# Patient Record
Sex: Female | Born: 1965 | Race: White | Hispanic: No | Marital: Married | Smoking: Never smoker
Health system: Southern US, Community
[De-identification: ages and names within clinical notes are randomized; demographics above are authoritative.]

## PROBLEM LIST (undated history)

## (undated) DIAGNOSIS — F419 Anxiety disorder, unspecified: Secondary | ICD-10-CM

## (undated) DIAGNOSIS — T7840XA Allergy, unspecified, initial encounter: Secondary | ICD-10-CM

## (undated) HISTORY — DX: Anxiety disorder, unspecified: F41.9

## (undated) HISTORY — PX: TONSILLECTOMY AND ADENOIDECTOMY: SUR1326

## (undated) HISTORY — PX: OTHER SURGICAL HISTORY: SHX169

## (undated) HISTORY — DX: Allergy, unspecified, initial encounter: T78.40XA

---

## 1998-04-09 ENCOUNTER — Other Ambulatory Visit: Admission: RE | Admit: 1998-04-09 | Discharge: 1998-04-09 | Payer: Self-pay | Admitting: Obstetrics and Gynecology

## 1999-03-25 ENCOUNTER — Encounter: Admission: RE | Admit: 1999-03-25 | Discharge: 1999-03-25 | Payer: Self-pay | Admitting: Obstetrics and Gynecology

## 1999-03-25 ENCOUNTER — Encounter: Payer: Self-pay | Admitting: Obstetrics and Gynecology

## 1999-05-27 ENCOUNTER — Other Ambulatory Visit: Admission: RE | Admit: 1999-05-27 | Discharge: 1999-05-27 | Payer: Self-pay | Admitting: Obstetrics and Gynecology

## 2000-05-23 ENCOUNTER — Other Ambulatory Visit: Admission: RE | Admit: 2000-05-23 | Discharge: 2000-05-23 | Payer: Self-pay | Admitting: Obstetrics and Gynecology

## 2000-07-25 ENCOUNTER — Other Ambulatory Visit: Admission: RE | Admit: 2000-07-25 | Discharge: 2000-07-25 | Payer: Self-pay | Admitting: Obstetrics and Gynecology

## 2000-10-17 ENCOUNTER — Ambulatory Visit: Admission: RE | Admit: 2000-10-17 | Discharge: 2000-10-17 | Payer: Self-pay | Admitting: *Deleted

## 2001-01-28 ENCOUNTER — Inpatient Hospital Stay (HOSPITAL_COMMUNITY): Admission: AD | Admit: 2001-01-28 | Discharge: 2001-01-31 | Payer: Self-pay | Admitting: Obstetrics and Gynecology

## 2001-02-01 ENCOUNTER — Encounter: Admission: RE | Admit: 2001-02-01 | Discharge: 2001-03-03 | Payer: Self-pay | Admitting: *Deleted

## 2001-03-07 ENCOUNTER — Other Ambulatory Visit: Admission: RE | Admit: 2001-03-07 | Discharge: 2001-03-07 | Payer: Self-pay | Admitting: *Deleted

## 2001-04-03 ENCOUNTER — Encounter: Admission: RE | Admit: 2001-04-03 | Discharge: 2001-05-03 | Payer: Self-pay | Admitting: *Deleted

## 2001-05-04 ENCOUNTER — Encounter: Admission: RE | Admit: 2001-05-04 | Discharge: 2001-06-03 | Payer: Self-pay | Admitting: *Deleted

## 2002-03-12 ENCOUNTER — Other Ambulatory Visit: Admission: RE | Admit: 2002-03-12 | Discharge: 2002-03-12 | Payer: Self-pay | Admitting: *Deleted

## 2004-03-27 HISTORY — PX: OTHER SURGICAL HISTORY: SHX169

## 2004-06-03 ENCOUNTER — Encounter: Admission: RE | Admit: 2004-06-03 | Discharge: 2004-06-03 | Payer: Self-pay | Admitting: *Deleted

## 2004-11-24 ENCOUNTER — Ambulatory Visit (HOSPITAL_COMMUNITY): Admission: RE | Admit: 2004-11-24 | Discharge: 2004-11-24 | Payer: Self-pay | Admitting: Urology

## 2004-11-24 ENCOUNTER — Ambulatory Visit (HOSPITAL_BASED_OUTPATIENT_CLINIC_OR_DEPARTMENT_OTHER): Admission: RE | Admit: 2004-11-24 | Discharge: 2004-11-24 | Payer: Self-pay | Admitting: Urology

## 2005-06-16 ENCOUNTER — Encounter: Admission: RE | Admit: 2005-06-16 | Discharge: 2005-06-16 | Payer: Self-pay | Admitting: *Deleted

## 2006-07-20 ENCOUNTER — Encounter: Admission: RE | Admit: 2006-07-20 | Discharge: 2006-07-20 | Payer: Self-pay | Admitting: *Deleted

## 2006-08-01 ENCOUNTER — Encounter: Admission: RE | Admit: 2006-08-01 | Discharge: 2006-08-01 | Payer: Self-pay | Admitting: *Deleted

## 2007-02-06 ENCOUNTER — Encounter: Admission: RE | Admit: 2007-02-06 | Discharge: 2007-02-06 | Payer: Self-pay | Admitting: *Deleted

## 2007-09-04 ENCOUNTER — Encounter: Admission: RE | Admit: 2007-09-04 | Discharge: 2007-09-04 | Payer: Self-pay | Admitting: *Deleted

## 2008-02-26 ENCOUNTER — Encounter: Admission: RE | Admit: 2008-02-26 | Discharge: 2008-02-26 | Payer: Self-pay | Admitting: Obstetrics

## 2008-06-12 ENCOUNTER — Encounter: Admission: RE | Admit: 2008-06-12 | Discharge: 2008-06-12 | Payer: Self-pay | Admitting: Obstetrics

## 2008-08-13 ENCOUNTER — Emergency Department (HOSPITAL_COMMUNITY): Admission: EM | Admit: 2008-08-13 | Discharge: 2008-08-13 | Payer: Self-pay | Admitting: Emergency Medicine

## 2008-09-18 ENCOUNTER — Encounter: Admission: RE | Admit: 2008-09-18 | Discharge: 2008-09-18 | Payer: Self-pay | Admitting: Obstetrics

## 2009-09-22 ENCOUNTER — Encounter: Admission: RE | Admit: 2009-09-22 | Discharge: 2009-09-22 | Payer: Self-pay | Admitting: Obstetrics

## 2010-08-12 NOTE — Op Note (Signed)
NAME:  Theresa Mccall, Theresa Mccall             ACCOUNT NO.:  [id number]   MEDICAL RECORD NO.:  [id number]          PATIENT TYPE:  AMB   LOCATION:  [facility]                         FACILITY:  [facility]   PHYSICIAN:  Sigmund I. Patsi Sears, M.D.DATE OF BIRTH:  February 03, 1966   DATE OF PROCEDURE:  11/24/2004  DATE OF DISCHARGE:                                 OPERATIVE REPORT   PREOPERATIVE DIAGNOSIS:  Stress urinary incontinence.   POSTOPERATIVE DIAGNOSIS:  Stress urinary incontinence.   OPERATIONS:  Child psychotherapist.   SURGEON:  Sigmund I. Patsi Sears, M.D.   ASSISTANT:  Vedia Coffer, N.P.-C.   PREPARATION:  After appropriate preanesthesia, the patient was brought to  the operating room, placed on the operating table in dorsal supine position  where general LMA anesthesia was introduced. She was then replaced in the  dorsal lithotomy position where the pubis was prepped with Betadine solution  and draped in the usual fashion..   DESCRIPTION OF PROCEDURE:  Cystourethroscopy was accomplished, and clear  efflux was seen from both orifices. Following this, the bladder was drained  of fluid with a Foley catheter. The 10 mL Marcaine 0.25 plain was injected  into the pubovaginal cervical arch tissue. A B&O suppository was given.   A 2 cm incision was made over the middle of the urethra, subcutaneous tissue  dissected with sharp and blunt dissection. Marking was accomplished 5 cm  lateral to the clitoris on both sides with a marker. Stab wounds were then  made bilaterally. Using the [facility], the Obtryx  pubovaginal sling was placed in retrograde fashion. Cystoscopy revealed no  evidence of any sling material in the bladder, and again, the trigone  appeared normal. The ureteral orifices were also normal.   Tensioning was accomplished with a large right-angle clamp behind the sling  material, and the wings of the sling were then cut in the subcutaneous  tissue in the  paralabial incisions.   These incisions were then closed with Dermabond. The wound was closed with  running 2-0 Vicryl suture. The patient was given IV Toradol, awakened and  taken to the recovery room in good condition.      Sigmund I. Patsi Sears, M.D.  Electronically Signed     SIT/MEDQ  D:  11/24/2004  T:  11/24/2004  Job:  [id number]

## 2010-08-12 NOTE — Op Note (Signed)
[facility]  Patient:    Theresa Mccall, Theresa Mccall Visit Number: [id number] MRN: [id number]          Service Type: OBS Location: [id number] Attending Physician:  Ermalene Searing Dictated by:   Marina Gravel, M.D. Proc. Date: 01/29/01 Admit Date:  01/28/2001                             Operative Report  PREOPERATIVE DIAGNOSIS:       Maternal exhaustion.  POSTOPERATIVE DIAGNOSIS:      Maternal exhaustion.  PROCEDURE:                    Vacuum-assisted vaginal delivery.  SURGEON:                      Marina Gravel, M.D.  ANESTHESIA:                   Epidural.  INDICATIONS:                  Maternal exhaustion, pushing for over two hours with the inability to push further.  The patient had good descent to complete-complete and +3, ROT position after spontaneous expulsive efforts. However, at this point, the patient was exhausted and requested assistance.  The operative risks were discussed including maternal trauma and fetal injury such as cephalohematoma or bruising of the scalp.  DESCRIPTION OF PROCEDURE:     After adequate anesthesia was obtained with her epidural, the patient was prepped and draped in the standard fashion.  The fetal vertex was noted to be at +3 station with visible fetal scalp between the labia without separation of the labia.  The presentation was again noted to be ROT.  The mushroom cup vacuum was placed on the fetal vertex just anterior to the posterior fontanelle in the midline.  One gentle traction and a second-degree episiotomy were required for delivery.  The remainder of the infant was delivered without difficulty.  The cord was clamped and cut.  A vigorous female infant was noted.  Apgars were 9 and 9. The infant was suctioned with the bulb on the perineum prior to delivery.  The placenta was expelled spontaneously.  The episiotomy was closed in layers with 2-0 Vicryl on the partial third-degree extension and 2-0 and 3-0  chromic on the vagina and perineum.  The patient tolerated the procedure well.  There were no complications. Mother and baby were doing well in the delivery room after the procedure. Dictated by:   Marina Gravel, M.D. Attending Physician:  Marina Gravel B DD:  01/29/01 TD:  01/30/01 Job: [id number]

## 2010-08-31 ENCOUNTER — Other Ambulatory Visit: Payer: Self-pay | Admitting: Obstetrics

## 2010-08-31 DIAGNOSIS — Z1231 Encounter for screening mammogram for malignant neoplasm of breast: Secondary | ICD-10-CM

## 2010-10-05 ENCOUNTER — Ambulatory Visit
Admission: RE | Admit: 2010-10-05 | Discharge: 2010-10-05 | Disposition: A | Discharge status: Home / Self Care | Payer: BC Managed Care – PPO | Source: Ambulatory Visit | Attending: Obstetrics | Admitting: Obstetrics

## 2010-10-05 DIAGNOSIS — Z1231 Encounter for screening mammogram for malignant neoplasm of breast: Secondary | ICD-10-CM

## 2011-06-26 HISTORY — PX: OTHER SURGICAL HISTORY: SHX169

## 2011-10-03 ENCOUNTER — Other Ambulatory Visit: Payer: Self-pay | Admitting: Obstetrics

## 2011-10-03 DIAGNOSIS — Z1231 Encounter for screening mammogram for malignant neoplasm of breast: Secondary | ICD-10-CM

## 2011-10-20 ENCOUNTER — Ambulatory Visit
Admission: RE | Admit: 2011-10-20 | Discharge: 2011-10-20 | Disposition: A | Discharge status: Home / Self Care | Payer: BC Managed Care – PPO | Source: Ambulatory Visit | Attending: Obstetrics | Admitting: Obstetrics

## 2011-10-20 DIAGNOSIS — Z1231 Encounter for screening mammogram for malignant neoplasm of breast: Secondary | ICD-10-CM

## 2012-10-22 ENCOUNTER — Other Ambulatory Visit: Payer: Self-pay

## 2012-10-22 DIAGNOSIS — Z1231 Encounter for screening mammogram for malignant neoplasm of breast: Secondary | ICD-10-CM

## 2012-11-08 ENCOUNTER — Ambulatory Visit: Payer: BC Managed Care – PPO

## 2012-11-15 ENCOUNTER — Ambulatory Visit
Admission: RE | Admit: 2012-11-15 | Discharge: 2012-11-15 | Disposition: A | Discharge status: Home / Self Care | Payer: BC Managed Care – PPO | Source: Ambulatory Visit

## 2012-11-15 DIAGNOSIS — Z1231 Encounter for screening mammogram for malignant neoplasm of breast: Secondary | ICD-10-CM

## 2013-03-27 HISTORY — PX: COLONOSCOPY: SHX174

## 2013-04-22 ENCOUNTER — Encounter: Payer: Self-pay | Admitting: Neurology

## 2013-04-24 ENCOUNTER — Ambulatory Visit (INDEPENDENT_AMBULATORY_CARE_PROVIDER_SITE_OTHER): Payer: BC Managed Care – PPO | Admitting: Neurology

## 2013-04-24 ENCOUNTER — Encounter (INDEPENDENT_AMBULATORY_CARE_PROVIDER_SITE_OTHER): Payer: Self-pay

## 2013-04-24 ENCOUNTER — Encounter: Payer: Self-pay | Admitting: Neurology

## 2013-04-24 VITALS — BP 134/84 | HR 67 | Ht 61.5 in | Wt 145.0 lb

## 2013-04-24 DIAGNOSIS — R55 Syncope and collapse: Secondary | ICD-10-CM | POA: Insufficient documentation

## 2013-04-24 NOTE — Progress Notes (Signed)
[facility]    Provider:  Dr Janann Colonel Referring Provider: Ala Dach., MD Primary Care Physician:  Ala Dach., MD  CC:  Presyncopal episodes  HPI:  Theresa Mccall is a 48 y.o. female here as a referral from Dr. Pamala Hurry for presyncopal episodes  On December 28 patient had acute onset of episodes where she started feeling unsteady, very to the right, this happened twice. She sat down and then noted she had double vision. The symptoms did not improve with sitting down or lying down. Her husband notes that she appeared drunk while walking. Patient describes feeling mildly off balance otherwise unremarkable. She is able to communicate fully with her family during these events. No automatisms, no extremity twitching. Prior to the episode started she had gone for brisk walk but otherwise had an unremarkable morning. Of note she did not eat breakfast which she normally does do. She was given some August juice, felt nauseous while taking it, lay down around 30 minutes later woke up and felt back to her normal self. No recent fevers emesis, no change of medication. No palpitations or diaphoresis during the event. No focal weakness or sensory changes.  That week had not slept well, was in a different time zone. Had flown to [place] on December 26. No prior events like this, no syncopal episodes.   Takes Xanax very infrequently, had not taken one around that time or the prior night.    Review of Systems: Out of a complete 14 system review, the patient complains of only the following symptoms, and all other reviewed systems are negative. No positive review of systems at this time  History   Social History  . Marital Status: Married    Spouse Name: Theresa Mccall    Number of Children: 1  . Years of Education: Doctorate   Occupational History  . Not on file.   Social History Main Topics  . Smoking status: Never Smoker   . Smokeless tobacco: Not on file  . Alcohol Use:  0.0 oz/week     Comment: 2-4 glasses q week  . Drug Use: No  . Sexual Activity: Not on file   Other Topics Concern  . Not on file   Social History Narrative   Patient is married Theresa Mccall), has 1 child   Patient is right handed   Education level is Doctorate   Caffeine consumption is 1 cup daily    Family History  Problem Relation Age of Onset  . Stroke Father   . Heart attack Father   . Cancer - Other Father     Bladder Cancer  . Heart disease Father   . Heart disease Maternal Grandfather   . Hypertension Father     Chronic  . Hypertension Maternal Grandfather     Chronic  . Cancer - Other Paternal Grandfather     Esophageal  . Cancer - Other Maternal Aunt     Breast  . COPD Father     No past medical history on file.  Past Surgical History  Procedure Laterality Date  . Tvto  2006    DR Gaynelle Arabian  . Nasal septal & sinus surgery    . Tonsillectomy and adenoidectomy    . Lasix  06/2011    Current Outpatient Prescriptions  Medication Sig Dispense Refill  . ALPRAZolam (XANAX) 1 MG tablet Take 1 mg by mouth every 4 (four) hours as needed for anxiety.      Marland Kitchen aspirin EC 81 MG tablet Take  81 mg by mouth daily.      Marland Kitchen azelastine (ASTELIN) 137 MCG/SPRAY nasal spray Place into both nostrils 2 (two) times daily. Use in each nostril as directed      . fexofenadine (ALLEGRA) 180 MG tablet Take 180 mg by mouth daily.      . fluticasone (FLONASE) 50 MCG/ACT nasal spray Place into both nostrils daily.      Marland Kitchen levocetirizine (XYZAL) 5 MG tablet       . LO LOESTRIN FE 1 MG-10 MCG / 10 MCG tablet       . Multiple Vitamin (MULTIVITAMIN) tablet Take 1 tablet by mouth daily.      . norethindrone-ethinyl estradiol (JUNEL FE,GILDESS FE,LOESTRIN FE) 1-20 MG-MCG tablet Take 1 tablet by mouth daily.      . Omeprazole (PRILOSEC PO) Take by mouth.      . zolpidem (AMBIEN) 10 MG tablet Take 10 mg by mouth at bedtime as needed for sleep.       No current facility-administered medications for  this visit.    Allergies as of 04/24/2013 - Review Complete 04/24/2013  Allergen Reaction Noted  . Ceftin [cefuroxime axetil]  04/22/2013  . Amoxicillin Rash 04/22/2013  . Demerol [meperidine] Rash 04/22/2013    Vitals: BP 128/78  Pulse 70  Ht 5' 1.5" (1.562 m)  Wt 145 lb (65.772 kg)  BMI 26.96 kg/m2 Last Weight:  Wt Readings from Last 1 Encounters:  04/24/13 145 lb (65.772 kg)   Last Height:   Ht Readings from Last 1 Encounters:  04/24/13 5' 1.5" (1.562 m)     Physical exam: Exam: Gen: NAD, conversant Eyes: anicteric sclerae, moist conjunctivae HENT: Atraumatic, oropharynx clear Neck: Trachea midline; supple,  Lungs: CTA, no wheezing, rales, rhonic                          CV: RRR, no MRG Abdomen: Soft, non-tender;  Extremities: No peripheral edema  Skin: Normal temperature, no rash,  Psych: Appropriate affect, pleasant  Neuro: MS: AA&Ox3, appropriately interactive, normal affect   Speech: fluent w/o paraphasic error  Memory: good recent and remote recall  CN: PERRL, EOMI no nystagmus, no ptosis, sensation intact to LT V1-V3 bilat, face symmetric, no weakness, hearing grossly intact, palate elevates symmetrically, shoulder shrug 5/5 bilat,  tongue protrudes midline, no fasiculations noted.  Motor: normal bulk and tone Strength: 5/5  In all extremities  Coord: rapid alternating and point-to-point (FNF, HTS) movements intact.  Reflexes: symmetrical, bilat downgoing toes  Sens: LT intact in all extremities  Gait: posture, stance, stride and arm-swing normal. Tandem gait intact. Able to walk on heels and toes. Romberg absent.   Assessment:  After physical and neurologic examination, review of laboratory studies, imaging, neurophysiology testing and pre-existing records, assessment will be reviewed on the problem list.  Plan:  Treatment plan and additional workup will be reviewed under Problem List.  1)Pre-syncope  [name] is a pleasant 48 woman  presenting for initial evaluation of single episode of gait instability, and double vision. This happened refill lasted less than an hour, patient had not eaten any breakfast that morning. Symptoms resolved after taking some orange juice and briefly resting. Physical exam is overall unremarkable. Suspect symptoms are likely related to hypoglycemia. Based on single event, normal exam and no further events would not pursue further diagnostic workup at this time. If any similar events would consider brain MRI, carotid ultrasound and/or EEG. Follow up as needed.  Jim Like, DO  [facility] [address] [place], [place] [address]  Phone [phone] Fax [phone]

## 2013-10-31 ENCOUNTER — Other Ambulatory Visit: Payer: Self-pay

## 2013-10-31 DIAGNOSIS — Z1231 Encounter for screening mammogram for malignant neoplasm of breast: Secondary | ICD-10-CM

## 2013-11-27 ENCOUNTER — Ambulatory Visit
Admission: RE | Admit: 2013-11-27 | Discharge: 2013-11-27 | Disposition: A | Discharge status: Home / Self Care | Payer: BC Managed Care – PPO | Source: Ambulatory Visit

## 2013-11-27 DIAGNOSIS — Z1231 Encounter for screening mammogram for malignant neoplasm of breast: Secondary | ICD-10-CM

## 2014-01-09 ENCOUNTER — Other Ambulatory Visit: Payer: Self-pay

## 2014-10-27 ENCOUNTER — Other Ambulatory Visit: Payer: Self-pay

## 2014-10-27 DIAGNOSIS — Z1231 Encounter for screening mammogram for malignant neoplasm of breast: Secondary | ICD-10-CM

## 2014-12-10 ENCOUNTER — Ambulatory Visit
Admission: RE | Admit: 2014-12-10 | Discharge: 2014-12-10 | Disposition: A | Discharge status: Home / Self Care | Payer: BLUE CROSS/BLUE SHIELD | Source: Ambulatory Visit

## 2014-12-10 DIAGNOSIS — Z1231 Encounter for screening mammogram for malignant neoplasm of breast: Secondary | ICD-10-CM

## 2015-01-15 ENCOUNTER — Ambulatory Visit
Admission: RE | Admit: 2015-01-15 | Discharge: 2015-01-15 | Disposition: A | Discharge status: Home / Self Care | Payer: BLUE CROSS/BLUE SHIELD | Source: Ambulatory Visit | Attending: Allergy and Immunology | Admitting: Allergy and Immunology

## 2015-01-15 ENCOUNTER — Other Ambulatory Visit: Payer: Self-pay | Admitting: Allergy and Immunology

## 2015-01-15 DIAGNOSIS — R05 Cough: Secondary | ICD-10-CM

## 2015-01-15 DIAGNOSIS — R059 Cough, unspecified: Secondary | ICD-10-CM

## 2015-07-02 DIAGNOSIS — F4322 Adjustment disorder with anxiety: Secondary | ICD-10-CM | POA: Diagnosis not present

## 2015-07-02 DIAGNOSIS — J3089 Other allergic rhinitis: Secondary | ICD-10-CM | POA: Diagnosis not present

## 2015-07-02 DIAGNOSIS — J3081 Allergic rhinitis due to animal (cat) (dog) hair and dander: Secondary | ICD-10-CM | POA: Diagnosis not present

## 2015-07-22 DIAGNOSIS — F4322 Adjustment disorder with anxiety: Secondary | ICD-10-CM | POA: Diagnosis not present

## 2015-08-12 DIAGNOSIS — F4322 Adjustment disorder with anxiety: Secondary | ICD-10-CM | POA: Diagnosis not present

## 2015-09-10 DIAGNOSIS — F4322 Adjustment disorder with anxiety: Secondary | ICD-10-CM | POA: Diagnosis not present

## 2015-10-08 DIAGNOSIS — F4322 Adjustment disorder with anxiety: Secondary | ICD-10-CM | POA: Diagnosis not present

## 2015-10-28 DIAGNOSIS — F4322 Adjustment disorder with anxiety: Secondary | ICD-10-CM | POA: Diagnosis not present

## 2015-11-25 DIAGNOSIS — F4322 Adjustment disorder with anxiety: Secondary | ICD-10-CM | POA: Diagnosis not present

## 2015-12-21 ENCOUNTER — Other Ambulatory Visit: Payer: Self-pay | Admitting: Obstetrics

## 2015-12-21 DIAGNOSIS — Z1231 Encounter for screening mammogram for malignant neoplasm of breast: Secondary | ICD-10-CM

## 2016-01-06 DIAGNOSIS — F4322 Adjustment disorder with anxiety: Secondary | ICD-10-CM | POA: Diagnosis not present

## 2016-01-07 DIAGNOSIS — N390 Urinary tract infection, site not specified: Secondary | ICD-10-CM | POA: Diagnosis not present

## 2016-01-07 DIAGNOSIS — Z01419 Encounter for gynecological examination (general) (routine) without abnormal findings: Secondary | ICD-10-CM | POA: Diagnosis not present

## 2016-01-07 DIAGNOSIS — Z1159 Encounter for screening for other viral diseases: Secondary | ICD-10-CM | POA: Diagnosis not present

## 2016-01-07 DIAGNOSIS — Z6827 Body mass index (BMI) 27.0-27.9, adult: Secondary | ICD-10-CM | POA: Diagnosis not present

## 2016-01-07 DIAGNOSIS — Z114 Encounter for screening for human immunodeficiency virus [HIV]: Secondary | ICD-10-CM | POA: Diagnosis not present

## 2016-01-07 DIAGNOSIS — Z113 Encounter for screening for infections with a predominantly sexual mode of transmission: Secondary | ICD-10-CM | POA: Diagnosis not present

## 2016-01-13 DIAGNOSIS — J301 Allergic rhinitis due to pollen: Secondary | ICD-10-CM | POA: Diagnosis not present

## 2016-01-13 DIAGNOSIS — J3089 Other allergic rhinitis: Secondary | ICD-10-CM | POA: Diagnosis not present

## 2016-01-13 DIAGNOSIS — R05 Cough: Secondary | ICD-10-CM | POA: Diagnosis not present

## 2016-01-13 DIAGNOSIS — J3081 Allergic rhinitis due to animal (cat) (dog) hair and dander: Secondary | ICD-10-CM | POA: Diagnosis not present

## 2016-01-21 ENCOUNTER — Ambulatory Visit
Admission: RE | Admit: 2016-01-21 | Discharge: 2016-01-21 | Disposition: A | Discharge status: Home / Self Care | Payer: BLUE CROSS/BLUE SHIELD | Source: Ambulatory Visit | Attending: Obstetrics | Admitting: Obstetrics

## 2016-01-21 DIAGNOSIS — Z1231 Encounter for screening mammogram for malignant neoplasm of breast: Secondary | ICD-10-CM

## 2016-02-11 DIAGNOSIS — F4322 Adjustment disorder with anxiety: Secondary | ICD-10-CM | POA: Diagnosis not present

## 2016-02-24 DIAGNOSIS — L821 Other seborrheic keratosis: Secondary | ICD-10-CM | POA: Diagnosis not present

## 2016-02-24 DIAGNOSIS — L820 Inflamed seborrheic keratosis: Secondary | ICD-10-CM | POA: Diagnosis not present

## 2016-02-24 DIAGNOSIS — D1800 Hemangioma unspecified site: Secondary | ICD-10-CM | POA: Diagnosis not present

## 2016-02-24 DIAGNOSIS — D225 Melanocytic nevi of trunk: Secondary | ICD-10-CM | POA: Diagnosis not present

## 2016-02-24 DIAGNOSIS — L814 Other melanin hyperpigmentation: Secondary | ICD-10-CM | POA: Diagnosis not present

## 2016-03-17 DIAGNOSIS — F4322 Adjustment disorder with anxiety: Secondary | ICD-10-CM | POA: Diagnosis not present

## 2016-03-23 DIAGNOSIS — J3081 Allergic rhinitis due to animal (cat) (dog) hair and dander: Secondary | ICD-10-CM | POA: Diagnosis not present

## 2016-03-23 DIAGNOSIS — J3089 Other allergic rhinitis: Secondary | ICD-10-CM | POA: Diagnosis not present

## 2016-04-14 DIAGNOSIS — F4322 Adjustment disorder with anxiety: Secondary | ICD-10-CM | POA: Diagnosis not present

## 2016-05-11 DIAGNOSIS — F4322 Adjustment disorder with anxiety: Secondary | ICD-10-CM | POA: Diagnosis not present

## 2016-06-09 DIAGNOSIS — F4322 Adjustment disorder with anxiety: Secondary | ICD-10-CM | POA: Diagnosis not present

## 2016-07-07 DIAGNOSIS — F4322 Adjustment disorder with anxiety: Secondary | ICD-10-CM | POA: Diagnosis not present

## 2016-07-11 ENCOUNTER — Emergency Department (HOSPITAL_COMMUNITY)
Admission: EM | Admit: 2016-07-11 | Discharge: 2016-07-11 | Disposition: A | Discharge status: Home / Self Care | Payer: BLUE CROSS/BLUE SHIELD | Attending: Emergency Medicine | Admitting: Emergency Medicine

## 2016-07-11 ENCOUNTER — Encounter (HOSPITAL_COMMUNITY): Payer: Self-pay | Admitting: Emergency Medicine

## 2016-07-11 DIAGNOSIS — Z79899 Other long term (current) drug therapy: Secondary | ICD-10-CM | POA: Diagnosis not present

## 2016-07-11 DIAGNOSIS — Y999 Unspecified external cause status: Secondary | ICD-10-CM | POA: Diagnosis not present

## 2016-07-11 DIAGNOSIS — R42 Dizziness and giddiness: Secondary | ICD-10-CM | POA: Insufficient documentation

## 2016-07-11 DIAGNOSIS — S0990XA Unspecified injury of head, initial encounter: Secondary | ICD-10-CM

## 2016-07-11 DIAGNOSIS — Y9342 Activity, yoga: Secondary | ICD-10-CM | POA: Insufficient documentation

## 2016-07-11 DIAGNOSIS — W228XXA Striking against or struck by other objects, initial encounter: Secondary | ICD-10-CM | POA: Diagnosis not present

## 2016-07-11 DIAGNOSIS — S060X0A Concussion without loss of consciousness, initial encounter: Secondary | ICD-10-CM | POA: Insufficient documentation

## 2016-07-11 DIAGNOSIS — S098XXA Other specified injuries of head, initial encounter: Secondary | ICD-10-CM | POA: Diagnosis not present

## 2016-07-11 DIAGNOSIS — Y9289 Other specified places as the place of occurrence of the external cause: Secondary | ICD-10-CM | POA: Insufficient documentation

## 2016-07-11 DIAGNOSIS — Z7982 Long term (current) use of aspirin: Secondary | ICD-10-CM | POA: Diagnosis not present

## 2016-07-11 MED ORDER — MECLIZINE HCL 12.5 MG PO TABS: 12.5000 mg | ORAL_TABLET | Freq: Three times a day (TID) | ORAL | 0 refills | Status: AC | PRN

## 2016-07-11 NOTE — ED Provider Notes (Signed)
Emergency Department Provider Note  By signing my name below, I, Collene Leyden, attest that this documentation has been prepared under the direction and in the presence of Margette Fast, MD. Electronically Signed: Collene Leyden, Scribe. 07/11/16. 2:00 PM.  I have reviewed the triage vital signs and the nursing notes.   HISTORY  Chief Complaint Head Injury   HPI  Theresa Mccall is a 51 y.o. female with no pertinent medical history, who presents to the Emergency Department complaining of a sudden-onset, constant posterior headache s/p head injury that happened 3 days ago. Patient states she used a yoga ball to "prop herself up" while reaching for papers off of a cabinet, when she fell and hit her head on the floor 4 days ago. Patient denies any loss of consciousness. Patient reports associated right shoulder pain, cervical neck pain, bilateral wrist pain, right elbow pain, and vertigo. No modifying factors indicated. Patient denies any ear pain, ear bleeding, dental injury, nausea, vomiting, or any symptoms with head rotation.   Patient is also complaining of occasional unsteady gait. Patient reports attempting to walk straight, when she begins to drift to the left. Patient reports being seen for this problem in the past. Patient was seen by a neurologist three years ago, in which her symptoms were noted to be related to hypoglycemia. Imaging was noted to be negative during this visit. Patient denies any other symptoms. No active gait instability since the fall.   History reviewed. No pertinent past medical history.  Patient Active Problem List   Diagnosis Date Noted  . Pre-syncope 04/24/2013    Past Surgical History:  Procedure Laterality Date  . Lasix  06/2011  . nasal septal & sinus surgery    . TONSILLECTOMY AND ADENOIDECTOMY    . TVTO  2006   DR Gaynelle Arabian    Current Outpatient Rx  . Order #: [id number] Class: Historical Med  . Order #: [id number] Class: Historical Med  .  Order #: [id number] Class: Historical Med  . Order #: [id number] Class: Historical Med  . Order #: [id number] Class: Historical Med  . Order #: [id number] Class: Historical Med  . Order #: [id number] Class: Historical Med  . Order #: [id number] Class: Print  . Order #: [id number] Class: Historical Med  . Order #: [id number] Class: Historical Med  . Order #: [id number] Class: Historical Med  . Order #: [id number] Class: Historical Med    Allergies Ceftin [cefuroxime axetil]; Amoxicillin; and Demerol [meperidine]  Family History  Problem Relation Age of Onset  . Stroke Father   . Heart attack Father   . Cancer - Other Father     Bladder Cancer  . Heart disease Father   . Heart disease Maternal Grandfather   . Hypertension Father     Chronic  . Hypertension Maternal Grandfather     Chronic  . Cancer - Other Paternal Grandfather     Esophageal  . Cancer - Other Maternal Aunt     Breast  . COPD Father     Social History Social History  Substance Use Topics  . Smoking status: Never Smoker  . Smokeless tobacco: Not on file  . Alcohol use 0.0 oz/week     Comment: 2-4 glasses q week    Review of Systems Constitutional: No fever/chills Eyes: No visual changes. ENT: No sore throat. Cardiovascular: Denies chest pain. Respiratory: Denies shortness of breath. Gastrointestinal: No abdominal pain.  No nausea, no vomiting.  No diarrhea.  No constipation. Genitourinary: Negative for dysuria. Musculoskeletal: Negative for back pain. Positive for bilateral pain.  Positive for right shoulder pain. Positive for neck pain.  Skin: Negative for rash. Neurological: Positive for headaches. No focal weakness or numbness. Positive for vertigo.   10-point ROS otherwise negative.  ____________________________________________   PHYSICAL EXAM:  VITAL SIGNS: ED Triage Vitals  Enc Vitals Group     BP 07/11/16 1323 (!) 187/94     Pulse Rate 07/11/16 1323 72     Resp 07/11/16 1323 18     Temp 07/11/16 1323 97.5 F  (36.4 C)     Temp Source 07/11/16 1323 Oral     SpO2 07/11/16 1323 99 %     Pain Score 07/11/16 1327 6   Constitutional: Alert and oriented. Well appearing and in no acute distress. Eyes/Ears: Conjunctivae are normal. EOMI. PERRL. Negative Dix-Hallpike. Normal TMs bilaterally. Normal external ear canals.  Head: Atraumatic. Nose: No congestion/rhinnorhea. Mouth/Throat: Mucous membranes are moist.  Oropharynx non-erythematous. Neck: No stridor.  Cardiovascular: Normal rate, regular rhythm. Good peripheral circulation. Grossly normal heart sounds.   Respiratory: Normal respiratory effort.  No retractions. Lungs CTAB. Gastrointestinal: Soft and nontender. No distention.  Musculoskeletal: No lower extremity tenderness nor edema. No gross deformities of extremities. Neurologic:  Normal speech and language. No gross focal neurologic deficits are appreciated. Normal CN exam 2-12. Normal gait. Normal finger-to-nose testing. No pronator drift.  Skin:  Skin is warm, dry and intact. No rash noted. Psychiatric: Mood and affect are normal. Speech and behavior are normal. ____________________________________________  RADIOLOGY  None ____________________________________________   PROCEDURES  Procedure(s) performed:   Procedures  None ____________________________________________   INITIAL IMPRESSION / ASSESSMENT AND PLAN / ED COURSE  Pertinent labs & imaging results that were available during my care of the patient were reviewed by me and considered in my medical decision making (see chart for details).  Patient presents to the ED 3 days after falling backwards from a yoga ball (2-3 feet off ground). No LOC. Patient has had positional vertigo at times since the fall along with HA. No evidence of head trauma on exam. Completely normal neurological exam and HEENT exam. Discussed the pros and cons of head imaging at this time and opted for imaging deferral given normal exam and time since  injury. Clinically suspect post-concussion syndrome. No clinical signs or symptoms to suggest acute intracranial hemorrhage, skull fracture, or TBI. Provided concussion provider f/u and meclizine for symptomatic vertigo. Vertigo is intermittent and positional. Unable to provoke symptoms during exam. No vertigo currently.   At this time, I do not feel there is any life-threatening condition present. I have reviewed and discussed all results (EKG, imaging, lab, urine as appropriate), exam findings with patient. I have reviewed nursing notes and appropriate previous records.  I feel the patient is safe to be discharged home without further emergent workup. Discussed usual and customary return precautions. Patient and family (if present) verbalize understanding and are comfortable with this plan.  Patient will follow-up with their primary care provider. If they do not have a primary care provider, information for follow-up has been provided to them. All questions have been answered.  ____________________________________________  FINAL CLINICAL IMPRESSION(S) / ED DIAGNOSES  Final diagnoses:  Injury of head, initial encounter  Concussion without loss of consciousness, initial encounter  Vertigo     MEDICATIONS GIVEN DURING THIS VISIT:  None  NEW OUTPATIENT MEDICATIONS STARTED DURING THIS VISIT:  Discharge Medication List as of 07/11/2016  2:10 PM    START taking these medications   Details  meclizine (ANTIVERT) 12.5 MG tablet  Take 1 tablet (12.5 mg total) by mouth 3 (three) times daily as needed for dizziness., Starting Tue 07/11/2016, Print        I personally performed the services described in this documentation, which was scribed in my presence. The recorded information has been reviewed and is accurate.    Nanda Quinton, MD Emergency Medicine    Margette Fast, MD 07/14/16 [phone]

## 2016-07-11 NOTE — ED Notes (Signed)
Pt states understanding of instructions. Home stable with steady gait.

## 2016-07-11 NOTE — Discharge Instructions (Signed)

## 2016-07-11 NOTE — ED Triage Notes (Signed)
Pt sts fell of yoga ball on Saturday and hit head on floor as well as wrists; pt unsure about LOC; pt sts dizziness when turning head since Sunday

## 2016-08-11 DIAGNOSIS — F4322 Adjustment disorder with anxiety: Secondary | ICD-10-CM | POA: Diagnosis not present

## 2016-09-01 DIAGNOSIS — F4322 Adjustment disorder with anxiety: Secondary | ICD-10-CM | POA: Diagnosis not present

## 2016-09-15 DIAGNOSIS — F4322 Adjustment disorder with anxiety: Secondary | ICD-10-CM | POA: Diagnosis not present

## 2016-09-28 DIAGNOSIS — F4322 Adjustment disorder with anxiety: Secondary | ICD-10-CM | POA: Diagnosis not present

## 2016-10-27 DIAGNOSIS — F4322 Adjustment disorder with anxiety: Secondary | ICD-10-CM | POA: Diagnosis not present

## 2016-11-16 DIAGNOSIS — F4322 Adjustment disorder with anxiety: Secondary | ICD-10-CM | POA: Diagnosis not present

## 2016-11-23 DIAGNOSIS — R238 Other skin changes: Secondary | ICD-10-CM | POA: Diagnosis not present

## 2016-11-23 DIAGNOSIS — L602 Onychogryphosis: Secondary | ICD-10-CM | POA: Diagnosis not present

## 2016-12-01 DIAGNOSIS — F4322 Adjustment disorder with anxiety: Secondary | ICD-10-CM | POA: Diagnosis not present

## 2016-12-14 DIAGNOSIS — J3081 Allergic rhinitis due to animal (cat) (dog) hair and dander: Secondary | ICD-10-CM | POA: Diagnosis not present

## 2016-12-14 DIAGNOSIS — J3089 Other allergic rhinitis: Secondary | ICD-10-CM | POA: Diagnosis not present

## 2016-12-22 ENCOUNTER — Other Ambulatory Visit: Payer: Self-pay | Admitting: Obstetrics

## 2016-12-22 DIAGNOSIS — Z1231 Encounter for screening mammogram for malignant neoplasm of breast: Secondary | ICD-10-CM

## 2017-01-06 DIAGNOSIS — F4322 Adjustment disorder with anxiety: Secondary | ICD-10-CM | POA: Diagnosis not present

## 2017-01-11 DIAGNOSIS — J301 Allergic rhinitis due to pollen: Secondary | ICD-10-CM | POA: Diagnosis not present

## 2017-01-11 DIAGNOSIS — R05 Cough: Secondary | ICD-10-CM | POA: Diagnosis not present

## 2017-01-11 DIAGNOSIS — J3089 Other allergic rhinitis: Secondary | ICD-10-CM | POA: Diagnosis not present

## 2017-01-11 DIAGNOSIS — J3081 Allergic rhinitis due to animal (cat) (dog) hair and dander: Secondary | ICD-10-CM | POA: Diagnosis not present

## 2017-01-12 DIAGNOSIS — Z6826 Body mass index (BMI) 26.0-26.9, adult: Secondary | ICD-10-CM | POA: Diagnosis not present

## 2017-01-12 DIAGNOSIS — Z01419 Encounter for gynecological examination (general) (routine) without abnormal findings: Secondary | ICD-10-CM | POA: Diagnosis not present

## 2017-01-12 DIAGNOSIS — Z130 Encounter for screening for diseases of the blood and blood-forming organs and certain disorders involving the immune mechanism: Secondary | ICD-10-CM | POA: Diagnosis not present

## 2017-01-12 DIAGNOSIS — Z13220 Encounter for screening for lipoid disorders: Secondary | ICD-10-CM | POA: Diagnosis not present

## 2017-01-12 DIAGNOSIS — Z1329 Encounter for screening for other suspected endocrine disorder: Secondary | ICD-10-CM | POA: Diagnosis not present

## 2017-01-12 DIAGNOSIS — Z0000 Encounter for general adult medical examination without abnormal findings: Secondary | ICD-10-CM | POA: Diagnosis not present

## 2017-01-12 DIAGNOSIS — Z23 Encounter for immunization: Secondary | ICD-10-CM | POA: Diagnosis not present

## 2017-01-12 DIAGNOSIS — Z131 Encounter for screening for diabetes mellitus: Secondary | ICD-10-CM | POA: Diagnosis not present

## 2017-02-09 ENCOUNTER — Ambulatory Visit: Payer: BLUE CROSS/BLUE SHIELD

## 2017-02-09 DIAGNOSIS — F4322 Adjustment disorder with anxiety: Secondary | ICD-10-CM | POA: Diagnosis not present

## 2017-02-23 ENCOUNTER — Ambulatory Visit
Admission: RE | Admit: 2017-02-23 | Discharge: 2017-02-23 | Disposition: A | Discharge status: Home / Self Care | Payer: BLUE CROSS/BLUE SHIELD | Source: Ambulatory Visit | Attending: Obstetrics | Admitting: Obstetrics

## 2017-02-23 DIAGNOSIS — Z1231 Encounter for screening mammogram for malignant neoplasm of breast: Secondary | ICD-10-CM

## 2017-03-01 DIAGNOSIS — D225 Melanocytic nevi of trunk: Secondary | ICD-10-CM | POA: Diagnosis not present

## 2017-03-01 DIAGNOSIS — D1800 Hemangioma unspecified site: Secondary | ICD-10-CM | POA: Diagnosis not present

## 2017-03-01 DIAGNOSIS — L814 Other melanin hyperpigmentation: Secondary | ICD-10-CM | POA: Diagnosis not present

## 2017-03-01 DIAGNOSIS — L821 Other seborrheic keratosis: Secondary | ICD-10-CM | POA: Diagnosis not present

## 2017-03-16 DIAGNOSIS — F4322 Adjustment disorder with anxiety: Secondary | ICD-10-CM | POA: Diagnosis not present

## 2017-04-03 IMAGING — MG 2D DIGITAL SCREENING BILATERAL MAMMOGRAM WITH CAD AND ADJUNCT TO
8 of 12 series · 8 of 28 positions shown · non-contrast
Comparison: Previous exam(s).

CLINICAL DATA: Screening.

EXAM:
2D DIGITAL SCREENING BILATERAL MAMMOGRAM WITH CAD AND ADJUNCT TOMO

[L MLO]
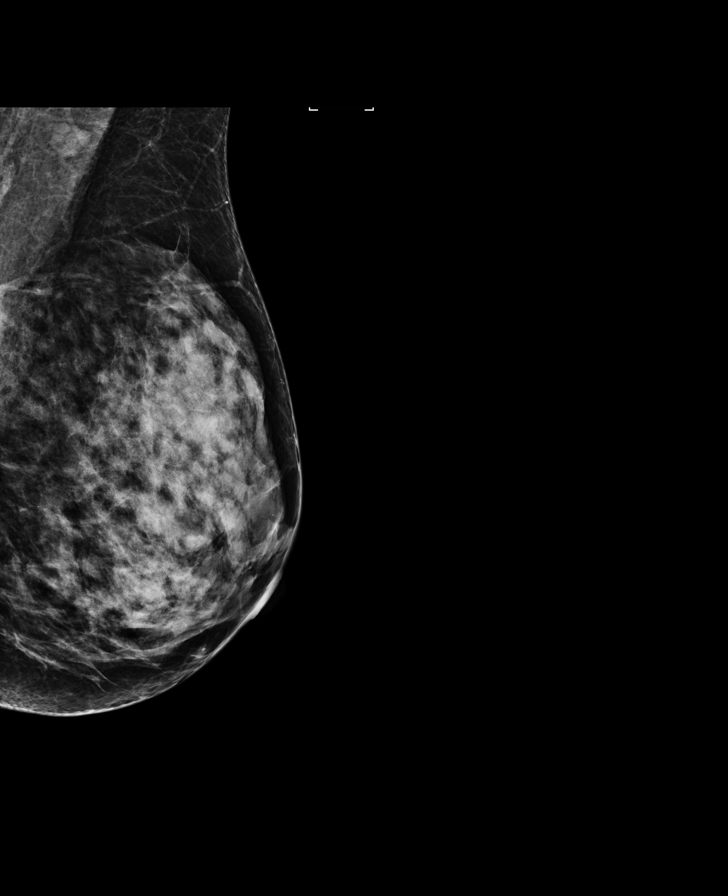

[L CC]
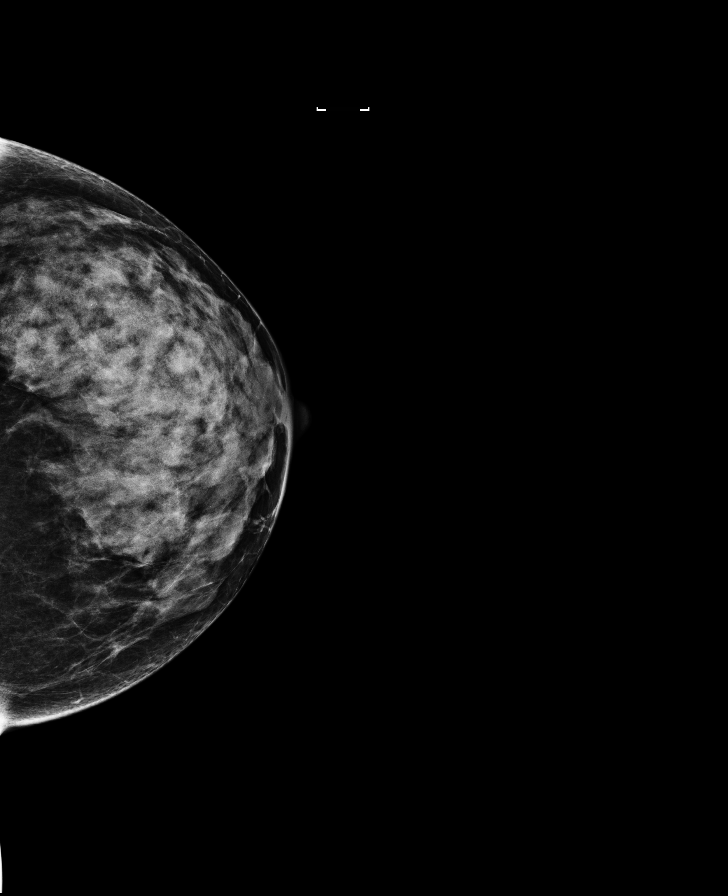

[R CC synth-2D]
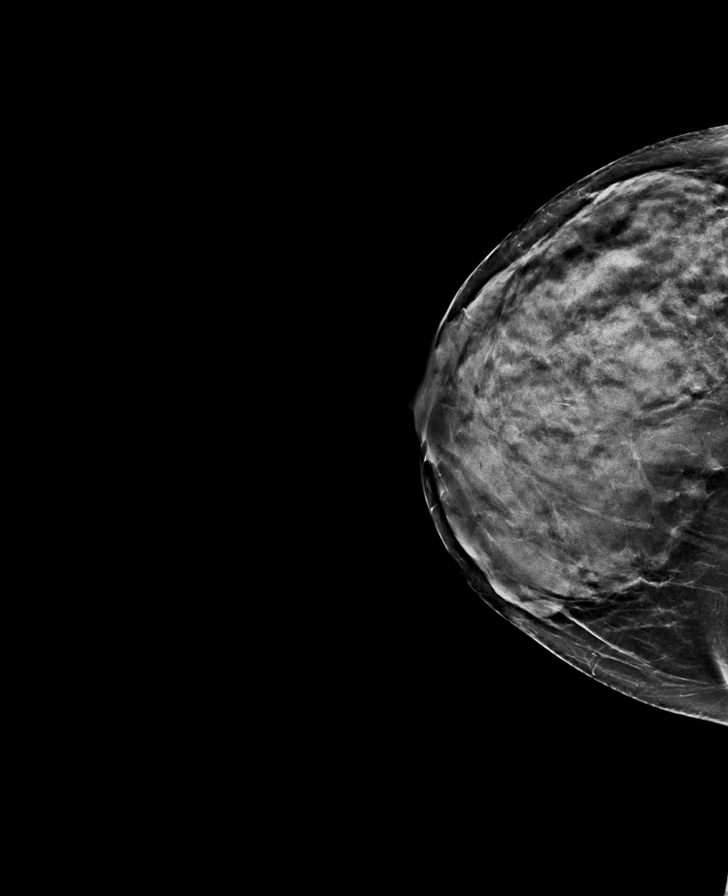

[R MLO]
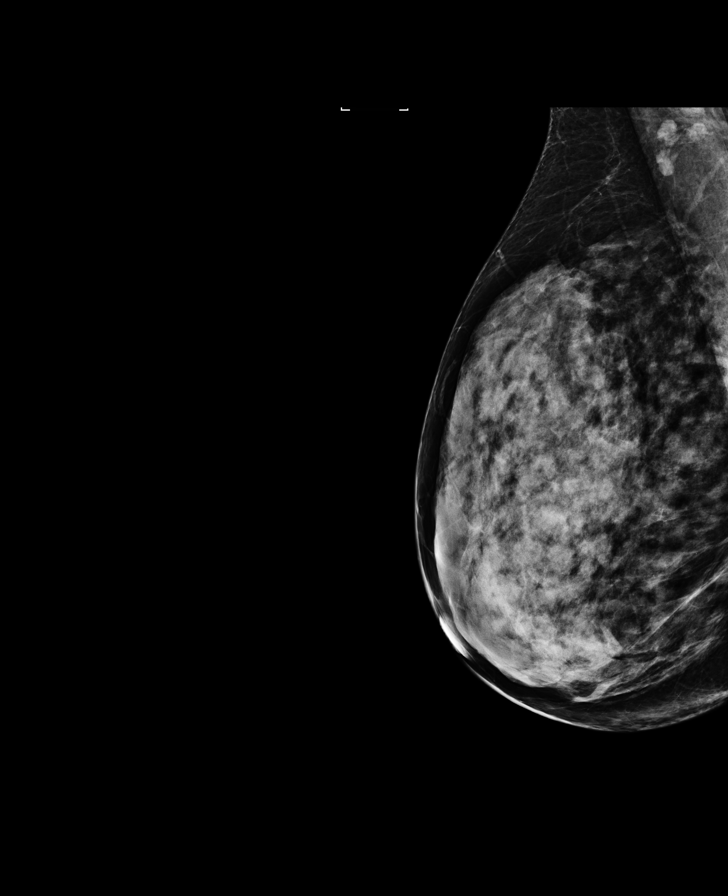

[R CC]
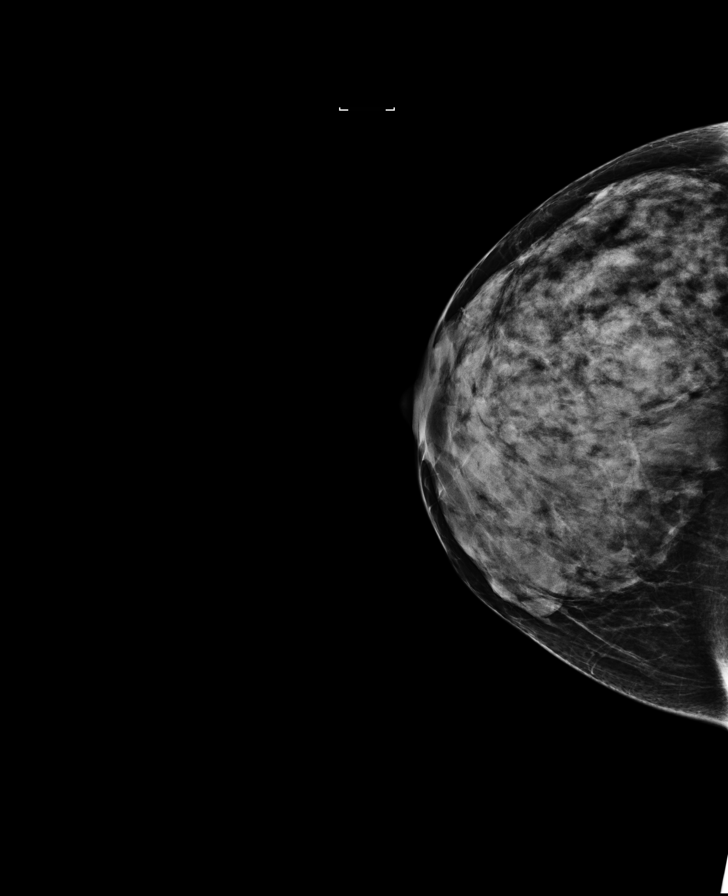

[L CC synth-2D]
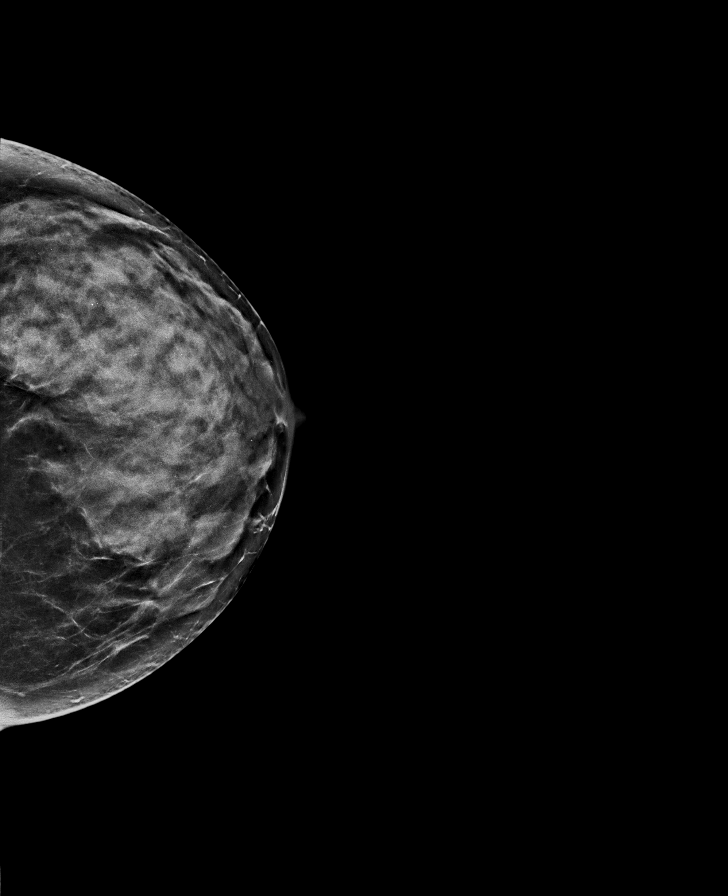

[L MLO synth-2D]
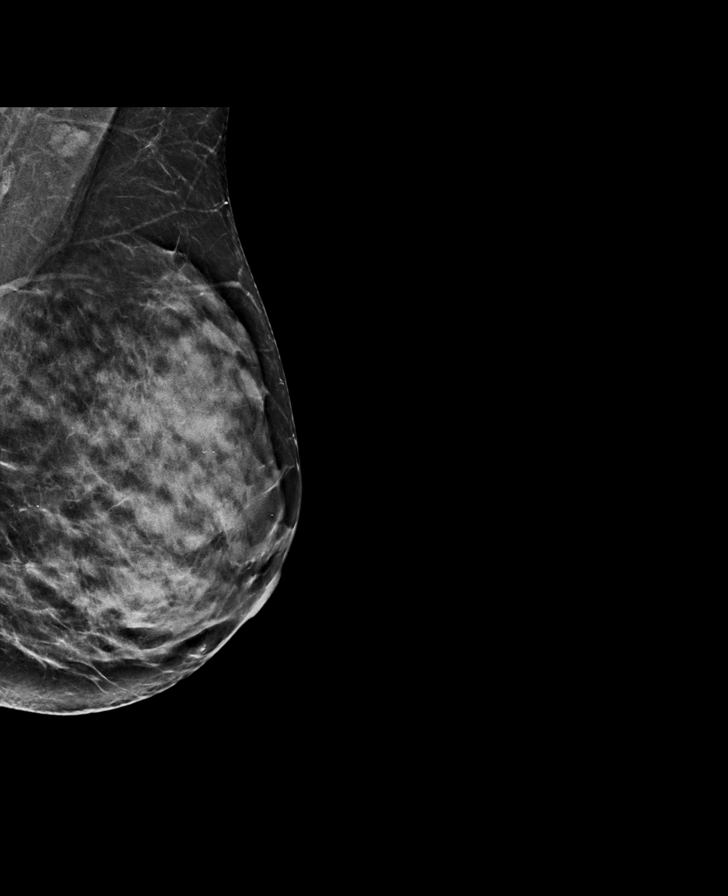

[R MLO synth-2D]
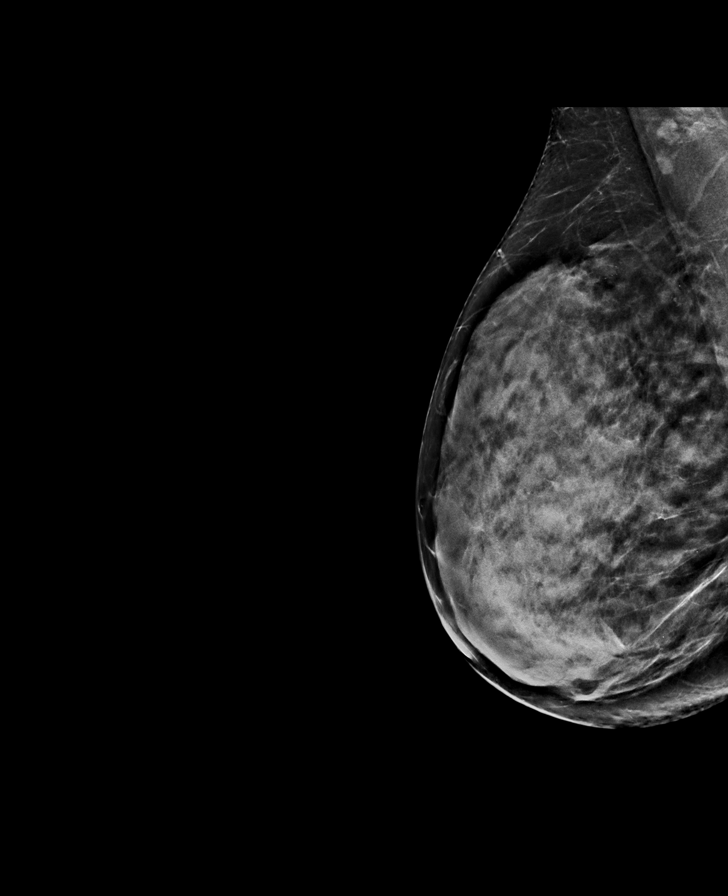

[8 of 28 positions shown; findings below may reference images not displayed]

ACR Breast Density Category d: The breast tissue is extremely dense,
which lowers the sensitivity of mammography.
FINDINGS: There are no findings suspicious for malignancy. Images were
processed with CAD.
IMPRESSION: No mammographic evidence of malignancy. A result letter of this
screening mammogram will be mailed directly to the patient.

RECOMMENDATION:
Screening mammogram in one year. (Code:US-D-RZ7)

BI-RADS CATEGORY  1: Negative.

## 2017-06-01 DIAGNOSIS — F4322 Adjustment disorder with anxiety: Secondary | ICD-10-CM | POA: Diagnosis not present

## 2017-11-21 DIAGNOSIS — H18821 Corneal disorder due to contact lens, right eye: Secondary | ICD-10-CM | POA: Diagnosis not present

## 2017-11-23 DIAGNOSIS — H18821 Corneal disorder due to contact lens, right eye: Secondary | ICD-10-CM | POA: Diagnosis not present

## 2018-01-10 DIAGNOSIS — J3089 Other allergic rhinitis: Secondary | ICD-10-CM | POA: Diagnosis not present

## 2018-01-10 DIAGNOSIS — J3081 Allergic rhinitis due to animal (cat) (dog) hair and dander: Secondary | ICD-10-CM | POA: Diagnosis not present

## 2018-01-10 DIAGNOSIS — J301 Allergic rhinitis due to pollen: Secondary | ICD-10-CM | POA: Diagnosis not present

## 2018-01-10 DIAGNOSIS — J069 Acute upper respiratory infection, unspecified: Secondary | ICD-10-CM | POA: Diagnosis not present

## 2018-01-11 DIAGNOSIS — J3081 Allergic rhinitis due to animal (cat) (dog) hair and dander: Secondary | ICD-10-CM | POA: Diagnosis not present

## 2018-01-11 DIAGNOSIS — J3089 Other allergic rhinitis: Secondary | ICD-10-CM | POA: Diagnosis not present

## 2018-01-17 DIAGNOSIS — Z1159 Encounter for screening for other viral diseases: Secondary | ICD-10-CM | POA: Diagnosis not present

## 2018-01-17 DIAGNOSIS — Z1329 Encounter for screening for other suspected endocrine disorder: Secondary | ICD-10-CM | POA: Diagnosis not present

## 2018-01-17 DIAGNOSIS — Z6825 Body mass index (BMI) 25.0-25.9, adult: Secondary | ICD-10-CM | POA: Diagnosis not present

## 2018-01-17 DIAGNOSIS — Z114 Encounter for screening for human immunodeficiency virus [HIV]: Secondary | ICD-10-CM | POA: Diagnosis not present

## 2018-01-17 DIAGNOSIS — Z23 Encounter for immunization: Secondary | ICD-10-CM | POA: Diagnosis not present

## 2018-01-17 DIAGNOSIS — Z01419 Encounter for gynecological examination (general) (routine) without abnormal findings: Secondary | ICD-10-CM | POA: Diagnosis not present

## 2018-01-17 DIAGNOSIS — Z1151 Encounter for screening for human papillomavirus (HPV): Secondary | ICD-10-CM | POA: Diagnosis not present

## 2018-01-17 DIAGNOSIS — Z113 Encounter for screening for infections with a predominantly sexual mode of transmission: Secondary | ICD-10-CM | POA: Diagnosis not present

## 2018-01-24 DIAGNOSIS — N950 Postmenopausal bleeding: Secondary | ICD-10-CM | POA: Diagnosis not present

## 2018-03-07 DIAGNOSIS — L821 Other seborrheic keratosis: Secondary | ICD-10-CM | POA: Diagnosis not present

## 2018-03-07 DIAGNOSIS — L814 Other melanin hyperpigmentation: Secondary | ICD-10-CM | POA: Diagnosis not present

## 2018-03-07 DIAGNOSIS — D225 Melanocytic nevi of trunk: Secondary | ICD-10-CM | POA: Diagnosis not present

## 2018-04-08 ENCOUNTER — Other Ambulatory Visit: Payer: Self-pay | Admitting: Obstetrics

## 2018-04-08 DIAGNOSIS — Z1231 Encounter for screening mammogram for malignant neoplasm of breast: Secondary | ICD-10-CM

## 2018-05-17 ENCOUNTER — Ambulatory Visit
Admission: RE | Admit: 2018-05-17 | Discharge: 2018-05-17 | Disposition: A | Discharge status: Home / Self Care | Payer: BLUE CROSS/BLUE SHIELD | Source: Ambulatory Visit | Attending: Obstetrics | Admitting: Obstetrics

## 2018-05-17 DIAGNOSIS — Z1231 Encounter for screening mammogram for malignant neoplasm of breast: Secondary | ICD-10-CM | POA: Diagnosis not present

## 2018-06-13 ENCOUNTER — Telehealth: Payer: Self-pay | Admitting: Gastroenterology

## 2018-06-13 NOTE — Telephone Encounter (Signed)
Pt would like to transfer her care with [place] and would like to be seen with Dr. Ardis Hughs. Record have been received and sent to Dr. Edison Nasuti to review.  Please advise for scheduling.

## 2018-07-11 DIAGNOSIS — N950 Postmenopausal bleeding: Secondary | ICD-10-CM | POA: Diagnosis not present

## 2018-07-11 DIAGNOSIS — Z3200 Encounter for pregnancy test, result unknown: Secondary | ICD-10-CM | POA: Diagnosis not present

## 2018-12-24 DIAGNOSIS — J3089 Other allergic rhinitis: Secondary | ICD-10-CM | POA: Diagnosis not present

## 2018-12-24 DIAGNOSIS — J3081 Allergic rhinitis due to animal (cat) (dog) hair and dander: Secondary | ICD-10-CM | POA: Diagnosis not present

## 2019-01-09 DIAGNOSIS — J3089 Other allergic rhinitis: Secondary | ICD-10-CM | POA: Diagnosis not present

## 2019-01-09 DIAGNOSIS — J3081 Allergic rhinitis due to animal (cat) (dog) hair and dander: Secondary | ICD-10-CM | POA: Diagnosis not present

## 2019-01-09 DIAGNOSIS — J301 Allergic rhinitis due to pollen: Secondary | ICD-10-CM | POA: Diagnosis not present

## 2019-01-31 DIAGNOSIS — Z13220 Encounter for screening for lipoid disorders: Secondary | ICD-10-CM | POA: Diagnosis not present

## 2019-01-31 DIAGNOSIS — Z0000 Encounter for general adult medical examination without abnormal findings: Secondary | ICD-10-CM | POA: Diagnosis not present

## 2019-01-31 DIAGNOSIS — Z113 Encounter for screening for infections with a predominantly sexual mode of transmission: Secondary | ICD-10-CM | POA: Diagnosis not present

## 2019-01-31 DIAGNOSIS — Z114 Encounter for screening for human immunodeficiency virus [HIV]: Secondary | ICD-10-CM | POA: Diagnosis not present

## 2019-01-31 DIAGNOSIS — Z1159 Encounter for screening for other viral diseases: Secondary | ICD-10-CM | POA: Diagnosis not present

## 2019-01-31 DIAGNOSIS — Z118 Encounter for screening for other infectious and parasitic diseases: Secondary | ICD-10-CM | POA: Diagnosis not present

## 2019-01-31 DIAGNOSIS — Z131 Encounter for screening for diabetes mellitus: Secondary | ICD-10-CM | POA: Diagnosis not present

## 2019-01-31 DIAGNOSIS — Z130 Encounter for screening for diseases of the blood and blood-forming organs and certain disorders involving the immune mechanism: Secondary | ICD-10-CM | POA: Diagnosis not present

## 2019-01-31 DIAGNOSIS — Z01419 Encounter for gynecological examination (general) (routine) without abnormal findings: Secondary | ICD-10-CM | POA: Diagnosis not present

## 2019-01-31 DIAGNOSIS — Z1329 Encounter for screening for other suspected endocrine disorder: Secondary | ICD-10-CM | POA: Diagnosis not present

## 2019-01-31 DIAGNOSIS — Z6825 Body mass index (BMI) 25.0-25.9, adult: Secondary | ICD-10-CM | POA: Diagnosis not present

## 2019-04-03 DIAGNOSIS — D225 Melanocytic nevi of trunk: Secondary | ICD-10-CM | POA: Diagnosis not present

## 2019-04-03 DIAGNOSIS — L821 Other seborrheic keratosis: Secondary | ICD-10-CM | POA: Diagnosis not present

## 2019-04-03 DIAGNOSIS — L578 Other skin changes due to chronic exposure to nonionizing radiation: Secondary | ICD-10-CM | POA: Diagnosis not present

## 2019-04-03 DIAGNOSIS — L814 Other melanin hyperpigmentation: Secondary | ICD-10-CM | POA: Diagnosis not present

## 2019-04-25 DIAGNOSIS — B3789 Other sites of candidiasis: Secondary | ICD-10-CM | POA: Diagnosis not present

## 2019-04-25 DIAGNOSIS — E617 Deficiency of multiple nutrient elements: Secondary | ICD-10-CM | POA: Diagnosis not present

## 2019-04-25 DIAGNOSIS — E7211 Homocystinuria: Secondary | ICD-10-CM | POA: Diagnosis not present

## 2019-04-25 DIAGNOSIS — E559 Vitamin D deficiency, unspecified: Secondary | ICD-10-CM | POA: Diagnosis not present

## 2019-04-25 DIAGNOSIS — B3782 Candidal enteritis: Secondary | ICD-10-CM | POA: Diagnosis not present

## 2019-04-25 DIAGNOSIS — E7212 Methylenetetrahydrofolate reductase deficiency: Secondary | ICD-10-CM | POA: Diagnosis not present

## 2019-04-25 DIAGNOSIS — R5383 Other fatigue: Secondary | ICD-10-CM | POA: Diagnosis not present

## 2019-07-30 ENCOUNTER — Other Ambulatory Visit: Payer: Self-pay | Admitting: Obstetrics

## 2019-07-30 DIAGNOSIS — Z1231 Encounter for screening mammogram for malignant neoplasm of breast: Secondary | ICD-10-CM

## 2019-08-14 DIAGNOSIS — T1501XA Foreign body in cornea, right eye, initial encounter: Secondary | ICD-10-CM | POA: Diagnosis not present

## 2019-08-15 ENCOUNTER — Other Ambulatory Visit: Payer: Self-pay

## 2019-08-15 ENCOUNTER — Ambulatory Visit
Admission: RE | Admit: 2019-08-15 | Discharge: 2019-08-15 | Disposition: A | Discharge status: Home / Self Care | Payer: BC Managed Care – PPO | Source: Ambulatory Visit | Attending: Obstetrics | Admitting: Obstetrics

## 2019-08-15 DIAGNOSIS — Z1231 Encounter for screening mammogram for malignant neoplasm of breast: Secondary | ICD-10-CM | POA: Diagnosis not present

## 2020-01-08 DIAGNOSIS — J3081 Allergic rhinitis due to animal (cat) (dog) hair and dander: Secondary | ICD-10-CM | POA: Diagnosis not present

## 2020-01-08 DIAGNOSIS — J301 Allergic rhinitis due to pollen: Secondary | ICD-10-CM | POA: Diagnosis not present

## 2020-01-08 DIAGNOSIS — J3089 Other allergic rhinitis: Secondary | ICD-10-CM | POA: Diagnosis not present

## 2020-02-06 DIAGNOSIS — H25011 Cortical age-related cataract, right eye: Secondary | ICD-10-CM | POA: Diagnosis not present

## 2020-03-10 DIAGNOSIS — Z20822 Contact with and (suspected) exposure to covid-19: Secondary | ICD-10-CM | POA: Diagnosis not present

## 2020-07-14 ENCOUNTER — Encounter: Payer: Self-pay | Admitting: Gastroenterology

## 2020-07-26 ENCOUNTER — Encounter: Payer: Self-pay | Admitting: Gastroenterology

## 2020-08-25 ENCOUNTER — Other Ambulatory Visit: Payer: Self-pay | Admitting: Obstetrics

## 2020-08-25 DIAGNOSIS — Z1231 Encounter for screening mammogram for malignant neoplasm of breast: Secondary | ICD-10-CM

## 2020-10-15 ENCOUNTER — Encounter: Payer: BC Managed Care – PPO | Admitting: Gastroenterology

## 2020-11-12 ENCOUNTER — Other Ambulatory Visit: Payer: Self-pay

## 2020-11-12 ENCOUNTER — Ambulatory Visit
Admission: RE | Admit: 2020-11-12 | Discharge: 2020-11-12 | Disposition: A | Discharge status: Home / Self Care | Payer: BC Managed Care – PPO | Source: Ambulatory Visit | Attending: Obstetrics | Admitting: Obstetrics

## 2020-11-12 DIAGNOSIS — Z1231 Encounter for screening mammogram for malignant neoplasm of breast: Secondary | ICD-10-CM

## 2020-11-17 ENCOUNTER — Other Ambulatory Visit: Payer: Self-pay | Admitting: Obstetrics

## 2020-11-17 DIAGNOSIS — R928 Other abnormal and inconclusive findings on diagnostic imaging of breast: Secondary | ICD-10-CM

## 2020-11-23 ENCOUNTER — Other Ambulatory Visit: Payer: Self-pay

## 2020-11-23 ENCOUNTER — Ambulatory Visit
Admission: RE | Admit: 2020-11-23 | Discharge: 2020-11-23 | Disposition: A | Discharge status: Home / Self Care | Payer: BC Managed Care – PPO | Source: Ambulatory Visit | Attending: Obstetrics | Admitting: Obstetrics

## 2020-11-23 DIAGNOSIS — R928 Other abnormal and inconclusive findings on diagnostic imaging of breast: Secondary | ICD-10-CM

## 2020-12-10 ENCOUNTER — Other Ambulatory Visit: Payer: Self-pay

## 2020-12-10 ENCOUNTER — Ambulatory Visit (AMBULATORY_SURGERY_CENTER): Payer: BC Managed Care – PPO | Admitting: *Deleted

## 2020-12-10 VITALS — Ht 61.5 in | Wt 125.0 lb

## 2020-12-10 DIAGNOSIS — Z86010 Personal history of colonic polyps: Secondary | ICD-10-CM

## 2020-12-10 MED ORDER — NA SULFATE-K SULFATE-MG SULF 17.5-3.13-1.6 GM/177ML PO SOLN: 1.0000 | ORAL | 0 refills | Status: DC

## 2020-12-10 NOTE — Progress Notes (Signed)

## 2020-12-14 ENCOUNTER — Encounter: Payer: Self-pay | Admitting: Gastroenterology

## 2020-12-22 ENCOUNTER — Telehealth: Payer: Self-pay | Admitting: Gastroenterology

## 2020-12-22 NOTE — Telephone Encounter (Signed)
Inbound call from patient states her pharmacy does not have suprep. Asks if it could be sent to another pharmacy or different prep.

## 2020-12-22 NOTE — Telephone Encounter (Signed)
Spoke with the patient. She will contact her pharmacy to make sure the suprep will be available for pick up in the morning. If she has any issues with getting the suprep she will use the miralax otc prep-new instructions sent to mychart-pt aware.

## 2020-12-24 ENCOUNTER — Encounter: Payer: Self-pay | Admitting: Gastroenterology

## 2020-12-24 ENCOUNTER — Ambulatory Visit (AMBULATORY_SURGERY_CENTER): Payer: BC Managed Care – PPO | Admitting: Gastroenterology

## 2020-12-24 ENCOUNTER — Other Ambulatory Visit: Payer: Self-pay

## 2020-12-24 VITALS — BP 122/79 | HR 60 | Temp 97.1°F | Resp 15 | Ht 61.5 in | Wt 125.0 lb

## 2020-12-24 DIAGNOSIS — D124 Benign neoplasm of descending colon: Secondary | ICD-10-CM

## 2020-12-24 DIAGNOSIS — D128 Benign neoplasm of rectum: Secondary | ICD-10-CM

## 2020-12-24 DIAGNOSIS — Z86010 Personal history of colonic polyps: Secondary | ICD-10-CM

## 2020-12-24 MED ORDER — SODIUM CHLORIDE 0.9 % IV SOLN: 500.0000 mL | Freq: Once | INTRAVENOUS | Status: DC

## 2020-12-24 NOTE — Progress Notes (Signed)
Pt's states no medical or surgical changes since previsit or office visit. 

## 2020-12-24 NOTE — Progress Notes (Signed)
HPI: This is a woman with h/o adenomatous polyp 2015 Dr. Earlean Shawl   ROS: complete GI ROS as described in HPI, all other review negative.  Constitutional:  No unintentional weight loss   Past Medical History:  Diagnosis Date   Allergy    Anxiety     Past Surgical History:  Procedure Laterality Date   COLONOSCOPY  2015   Dr.Medoff   Lasix  06/26/2011   nasal septal & sinus surgery     TONSILLECTOMY AND ADENOIDECTOMY     TVTO  03/27/2004   DR Gaynelle Arabian    Current Outpatient Medications  Medication Sig Dispense Refill   azelastine (ASTELIN) 137 MCG/SPRAY nasal spray Place into both nostrils 2 (two) times daily. Use in each nostril as directed     Cholecalciferol (VITAMIN D3 PO) Take by mouth.     CREATINE PO Take by mouth.     DIGESTIVE ENZYMES PO Take by mouth.     levocetirizine (XYZAL) 5 MG tablet      MAGNESIUM PO Take by mouth.     Multiple Vitamin (MULTIVITAMIN) tablet Take 1 tablet by mouth daily.     NON FORMULARY Take 1 tablet by mouth 3 (three) times daily. Bile Min     NON FORMULARY Take 1 tablet by mouth daily. Glycoberine     OVER THE COUNTER MEDICATION Take 2 tablets by mouth in the morning and at bedtime. Methyl SP     OVER THE COUNTER MEDICATION Take 350 mg by mouth daily. Tributeryn     progesterone (PROMETRIUM) 100 MG capsule Take 100 mg by mouth at bedtime.     ALPRAZolam (XANAX) 1 MG tablet Take 1 mg by mouth every 4 (four) hours as needed for anxiety. (Patient not taking: No sig reported)     Estradiol (DIVIGEL) 1 MG/GM GEL      fluticasone (FLONASE) 50 MCG/ACT nasal spray Place into both nostrils daily. (Patient not taking: No sig reported)     meclizine (ANTIVERT) 12.5 MG tablet Take 1 tablet (12.5 mg total) by mouth 3 (three) times daily as needed for dizziness. 30 tablet 0   zolpidem (AMBIEN) 10 MG tablet Take 10 mg by mouth at bedtime as needed for sleep. (Patient not taking: No sig reported)     Current Facility-Administered Medications  Medication  Dose Route Frequency Provider Last Rate Last Admin   0.9 %  sodium chloride infusion  500 mL Intravenous Once Milus Banister, MD        Allergies as of 12/24/2020 - Review Complete 12/24/2020  Allergen Reaction Noted   Ceftin [cefuroxime axetil]  04/22/2013   Amoxicillin Rash 04/22/2013   Demerol [meperidine] Rash 04/22/2013    Family History  Problem Relation Age of Onset   Colon polyps Father    Stroke Father    Heart attack Father    Cancer - Other Father        Bladder Cancer   Heart disease Father    Hypertension Father        Chronic   COPD Father    Cancer - Other Maternal Aunt        Breast   Breast cancer Maternal Aunt    Heart disease Maternal Grandfather    Hypertension Maternal Grandfather        Chronic   Esophageal cancer Paternal Grandfather    Cancer - Other Paternal Grandfather        Esophageal   Colon cancer Neg Hx    Rectal cancer Neg  Hx    Stomach cancer Neg Hx     Social History   Socioeconomic History   Marital status: Married    Spouse name: [name]   Number of children: 1   Years of education: Doctorate   Highest education level: Not on file  Occupational History   Not on file  Tobacco Use   Smoking status: Never   Smokeless tobacco: Never  Vaping Use   Vaping Use: Never used  Substance and Sexual Activity   Alcohol use: Yes    Alcohol/week: 2.0 - 3.0 standard drinks    Types: 2 - 3 Glasses of wine per week   Drug use: No   Sexual activity: Not on file  Other Topics Concern   Not on file  Social History Narrative   Patient is married [name]), has 1 child   Patient is right handed   Education level is Doctorate   Caffeine consumption is 1 cup daily   Social Determinants of Radio broadcast assistant Strain: Not on file  Food Insecurity: Not on file  Transportation Needs: Not on file  Physical Activity: Not on file  Stress: Not on file  Social Connections: Not on file  Intimate Partner Violence: Not on file     Physical  Exam: BP 120/79   Pulse (!) 58   Temp (!) 97.1 F (36.2 C)   Ht 5' 1.5" (1.562 m)   Wt 125 lb (56.7 kg)   LMP 09/16/2010   SpO2 98%   BMI 23.24 kg/m  Constitutional: generally well-appearing Psychiatric: alert and oriented x3 Lungs: CTA bilaterally Heart: no MCR  Assessment and plan: 55 y.o. female with h/o adenomatous polyp  Colonoscopy today  Care is appropriate for the ambulatory setting.  Owens Loffler, MD [facility] 12/24/2020, 10:18 AM

## 2020-12-24 NOTE — Op Note (Signed)
[place] Patient Name: Theresa Mccall Procedure Date: 12/24/2020 10:01 AM MRN: [id number] Endoscopist: Milus Banister , MD Age: 55 Referring MD:  Date of Birth: [birth year] Gender: Female Account #: [id number] Procedure:                Colonoscopy Indications:              High risk colon cancer surveillance: Personal                            history of colonic polyps; Colonoscopy Dr. Earlean Shawl                            2015 two subCM polyps removed, one was adenomatous Medicines:                Monitored Anesthesia Care Procedure:                Pre-Anesthesia Assessment:                           - Prior to the procedure, a History and Physical                            was performed, and patient medications and                            allergies were reviewed. The patient's tolerance of                            previous anesthesia was also reviewed. The risks                            and benefits of the procedure and the sedation                            options and risks were discussed with the patient.                            All questions were answered, and informed consent                            was obtained. Prior Anticoagulants: The patient has                            taken no previous anticoagulant or antiplatelet                            agents. ASA Grade Assessment: II - A patient with                            mild systemic disease. After reviewing the risks                            and benefits, the patient was deemed in  satisfactory condition to undergo the procedure.                           After obtaining informed consent, the colonoscope                            was passed under direct vision. Throughout the                            procedure, the patient's blood pressure, pulse, and                            oxygen saturations were monitored continuously. The                            CF [id number]  #[id number] was introduced through the anus                            and advanced to the the cecum, identified by                            appendiceal orifice and ileocecal valve. The                            colonoscopy was performed without difficulty. The                            patient tolerated the procedure well. The quality                            of the bowel preparation was good. The ileocecal                            valve, appendiceal orifice, and rectum were                            photographed. Scope In: 10:25:30 AM Scope Out: 10:38:06 AM Scope Withdrawal Time: 0 hours 8 minutes 17 seconds  Total Procedure Duration: 0 hours 12 minutes 36 seconds  Findings:                 Two sessile polyps were found in the rectum and                            descending colon. The polyps were 3 to 7 mm in                            size. These polyps were removed with a cold snare.                            Resection and retrieval were complete.                           The exam was otherwise without abnormality on  direct and retroflexion views. Complications:            No immediate complications. Estimated blood loss:                            None. Estimated Blood Loss:     Estimated blood loss: none. Impression:               - Two 3 to 7 mm polyps in the rectum and in the                            descending colon, removed with a cold snare.                            Resected and retrieved.                           - The examination was otherwise normal on direct                            and retroflexion views. Recommendation:           - Patient has a contact number available for                            emergencies. The signs and symptoms of potential                            delayed complications were discussed with the                            patient. Return to normal activities tomorrow.                            Written  discharge instructions were provided to the                            patient.                           - Resume previous diet.                           - Continue present medications.                           - Await pathology results. Milus Banister, MD 12/24/2020 10:41:29 AM This report has been signed electronically.

## 2020-12-24 NOTE — Progress Notes (Signed)
Called to room to assist during endoscopic procedure.  Patient ID and intended procedure confirmed with present staff. Received instructions for my participation in the procedure from the performing physician.  

## 2020-12-24 NOTE — Progress Notes (Signed)
VS taken by C.W. 

## 2020-12-24 NOTE — Patient Instructions (Signed)
YOU HAD AN ENDOSCOPIC PROCEDURE TODAY AT [facility]:   Refer to the procedure report that was given to you for any specific questions about what was found during the examination.  If the procedure report does not answer your questions, please call your gastroenterologist to clarify.  If you requested that your care partner not be given the details of your procedure findings, then the procedure report has been included in a sealed envelope for you to review at your convenience later.  **Handouts given on polyps**  YOU SHOULD EXPECT: Some feelings of bloating in the abdomen. Passage of more gas than usual.  Walking can help get rid of the air that was put into your GI tract during the procedure and reduce the bloating. If you had a lower endoscopy (such as a colonoscopy or flexible sigmoidoscopy) you may notice spotting of blood in your stool or on the toilet paper. If you underwent a bowel prep for your procedure, you may not have a normal bowel movement for a few days.  Please Note:  You might notice some irritation and congestion in your nose or some drainage.  This is from the oxygen used during your procedure.  There is no need for concern and it should clear up in a day or so.  SYMPTOMS TO REPORT IMMEDIATELY:  Following lower endoscopy (colonoscopy or flexible sigmoidoscopy):  Excessive amounts of blood in the stool  Significant tenderness or worsening of abdominal pains  Swelling of the abdomen that is new, acute  Fever of 100F or higher  For urgent or emergent issues, a gastroenterologist can be reached at any hour by calling [phone]. Do not use MyChart messaging for urgent concerns.    DIET:  We do recommend a small meal at first, but then you may proceed to your regular diet.  Drink plenty of fluids but you should avoid alcoholic beverages for 24 hours.  ACTIVITY:  You should plan to take it easy for the rest of today and you should NOT DRIVE or use heavy  machinery until tomorrow (because of the sedation medicines used during the test).    FOLLOW UP: Our staff will call the number listed on your records 48-72 hours following your procedure to check on you and address any questions or concerns that you may have regarding the information given to you following your procedure. If we do not reach you, we will leave a message.  We will attempt to reach you two times.  During this call, we will ask if you have developed any symptoms of COVID 19. If you develop any symptoms (ie: fever, flu-like symptoms, shortness of breath, cough etc.) before then, please call [phone].  If you test positive for Covid 19 in the 2 weeks post procedure, please call and report this information to [place].    If any biopsies were taken you will be contacted by phone or by letter within the next 1-3 weeks.  Please call us at [phone] if you have not heard about the biopsies in 3 weeks.    SIGNATURES/CONFIDENTIALITY: You and/or your care partner have signed paperwork which will be entered into your electronic medical record.  These signatures attest to the fact that that the information above on your After Visit Summary has been reviewed and is understood.  Full responsibility of the confidentiality of this discharge information lies with you and/or your care-partner.

## 2020-12-24 NOTE — Progress Notes (Signed)
PT taken to PACU. Monitors in place. VSS. Report given to RN. 

## 2020-12-28 ENCOUNTER — Telehealth: Payer: Self-pay

## 2020-12-28 NOTE — Telephone Encounter (Signed)
  Follow up Call-  Call back number 12/24/2020  Post procedure Call Back phone  # [phone]  Permission to leave phone message Yes  Some recent data might be hidden     Patient questions:  Do you have a fever, pain , or abdominal swelling? No. Pain Score  0 *  Have you tolerated food without any problems? Yes.    Have you been able to return to your normal activities? Yes.    Do you have any questions about your discharge instructions: Diet   No. Medications  No. Follow up visit  No.  Do you have questions or concerns about your Care? No.  Actions: * If pain score is 4 or above: No action needed, pain <4.

## 2021-01-02 ENCOUNTER — Encounter: Payer: Self-pay | Admitting: Gastroenterology

## 2021-01-04 ENCOUNTER — Encounter: Payer: Self-pay | Admitting: Gastroenterology

## 2021-04-12 ENCOUNTER — Other Ambulatory Visit: Payer: Self-pay | Admitting: Obstetrics

## 2021-04-12 DIAGNOSIS — R921 Mammographic calcification found on diagnostic imaging of breast: Secondary | ICD-10-CM

## 2021-06-03 ENCOUNTER — Other Ambulatory Visit: Payer: Self-pay | Admitting: Obstetrics

## 2021-06-03 ENCOUNTER — Other Ambulatory Visit: Payer: Self-pay

## 2021-06-03 ENCOUNTER — Ambulatory Visit
Admission: RE | Admit: 2021-06-03 | Discharge: 2021-06-03 | Disposition: A | Discharge status: Home / Self Care | Payer: BC Managed Care – PPO | Source: Ambulatory Visit | Attending: Obstetrics | Admitting: Obstetrics

## 2021-06-03 DIAGNOSIS — R921 Mammographic calcification found on diagnostic imaging of breast: Secondary | ICD-10-CM

## 2021-12-09 ENCOUNTER — Ambulatory Visit
Admission: RE | Admit: 2021-12-09 | Discharge: 2021-12-09 | Disposition: A | Discharge status: Home / Self Care | Payer: BC Managed Care – PPO | Source: Ambulatory Visit | Attending: Obstetrics | Admitting: Obstetrics

## 2021-12-09 DIAGNOSIS — R921 Mammographic calcification found on diagnostic imaging of breast: Secondary | ICD-10-CM

## 2022-11-01 ENCOUNTER — Other Ambulatory Visit: Payer: Self-pay | Admitting: Obstetrics

## 2022-11-01 DIAGNOSIS — R921 Mammographic calcification found on diagnostic imaging of breast: Secondary | ICD-10-CM

## 2022-12-14 ENCOUNTER — Ambulatory Visit
Admission: RE | Admit: 2022-12-14 | Discharge: 2022-12-14 | Disposition: A | Discharge status: Home / Self Care | Payer: BC Managed Care – PPO | Source: Ambulatory Visit | Attending: Obstetrics | Admitting: Obstetrics

## 2022-12-14 DIAGNOSIS — R921 Mammographic calcification found on diagnostic imaging of breast: Secondary | ICD-10-CM

## 2023-11-19 ENCOUNTER — Other Ambulatory Visit: Payer: Self-pay | Admitting: Obstetrics

## 2023-11-19 DIAGNOSIS — Z1231 Encounter for screening mammogram for malignant neoplasm of breast: Secondary | ICD-10-CM

## 2023-12-20 ENCOUNTER — Ambulatory Visit
Admission: RE | Admit: 2023-12-20 | Discharge: 2023-12-20 | Disposition: A | Discharge status: Home / Self Care | Source: Ambulatory Visit | Attending: Obstetrics | Admitting: Obstetrics

## 2023-12-20 DIAGNOSIS — Z1231 Encounter for screening mammogram for malignant neoplasm of breast: Secondary | ICD-10-CM
